# Patient Record
Sex: Female | Born: 1935 | Race: White | Hispanic: No | Smoking: Never smoker
Health system: Southern US, Community
[De-identification: ages and names within clinical notes are randomized; demographics above are authoritative.]

## PROBLEM LIST (undated history)

## (undated) DIAGNOSIS — E785 Hyperlipidemia, unspecified: Secondary | ICD-10-CM

---

## 2014-07-25 ENCOUNTER — Emergency Department (INDEPENDENT_AMBULATORY_CARE_PROVIDER_SITE_OTHER): Payer: Medicare Other

## 2014-07-25 ENCOUNTER — Encounter: Payer: Self-pay | Admitting: *Deleted

## 2014-07-25 ENCOUNTER — Emergency Department (INDEPENDENT_AMBULATORY_CARE_PROVIDER_SITE_OTHER)
Admission: EM | Admit: 2014-07-25 | Discharge: 2014-07-25 | Disposition: A | Discharge status: Home / Self Care | Payer: Medicare Other | Source: Home / Self Care | Attending: Family Medicine | Admitting: Family Medicine

## 2014-07-25 DIAGNOSIS — S0083XA Contusion of other part of head, initial encounter: Secondary | ICD-10-CM

## 2014-07-25 DIAGNOSIS — S61412A Laceration without foreign body of left hand, initial encounter: Secondary | ICD-10-CM

## 2014-07-25 DIAGNOSIS — M25512 Pain in left shoulder: Secondary | ICD-10-CM

## 2014-07-25 DIAGNOSIS — S80212A Abrasion, left knee, initial encounter: Secondary | ICD-10-CM

## 2014-07-25 DIAGNOSIS — S41112A Laceration without foreign body of left upper arm, initial encounter: Secondary | ICD-10-CM

## 2014-07-25 DIAGNOSIS — M19032 Primary osteoarthritis, left wrist: Secondary | ICD-10-CM | POA: Diagnosis not present

## 2014-07-25 HISTORY — DX: Hyperlipidemia, unspecified: E78.5

## 2014-07-25 NOTE — ED Notes (Signed)
Pt c/o LT side facial pain, LT hand/wrist abrasion, LT shoulder pain and abrasion and LT knee x 1345 post fall on concrete at a ice cream shop.

## 2014-07-25 NOTE — ED Provider Notes (Signed)
CSN: [id number]     Arrival date & time 07/25/14  1559 History   First MD Initiated Contact with Patient 07/25/14 1614     Chief Complaint  Patient presents with  . Fall      HPI Comments: About 3 hours ago patient slipped on a floor step-off and fell, striking her left knee, left hand/wrist, left shoulder, and left face.  She suffered skin tear to left knee, left thumb, and left shoulder, and an abrasion to her left cheek.  No loss of consciousness.  She denies headache, blurred vision, nausea/vomiting, weakness or other neurologic symptoms.  She states that she would like to visit her dentist to evaluate the wound on her face.  The history is provided by the patient and the spouse.    Past Medical History  Diagnosis Date  . Hyperlipidemia    History reviewed. No pertinent past surgical history. History reviewed. No pertinent family history. History  Substance Use Topics  . Smoking status: Never Smoker   . Smokeless tobacco: Not on file  . Alcohol Use: No   OB History    No data available     Review of Systems  Constitutional: Negative for diaphoresis and activity change.  HENT: Positive for facial swelling. Negative for ear pain and nosebleeds.   Eyes: Negative.   Respiratory: Negative.   Cardiovascular: Negative.   Gastrointestinal: Negative.   Genitourinary: Negative.   Musculoskeletal: Positive for joint swelling. Negative for myalgias, back pain, neck pain and neck stiffness.  Skin: Positive for wound.  Neurological: Negative for dizziness, syncope, facial asymmetry, speech difficulty, weakness, light-headedness, numbness and headaches.  All other systems reviewed and are negative.   Allergies  Codeine and Sulfa antibiotics  Home Medications   Prior to Admission medications   Not on File   BP 124/55 mmHg  Pulse 69  Resp 16  SpO2 98% Physical Exam  Constitutional: She is oriented to person, place, and time. She appears well-developed and well-nourished. No  distress.  Patient is alert and oriented.  HENT:  Head: Normocephalic. Head is with abrasion and with contusion. Head is without raccoon's eyes, without Battle's sign and without laceration.    Right Ear: External ear normal.  Left Ear: External ear normal.  Nose: Nose normal.  Mouth/Throat: Oropharynx is clear and moist.  Patient has swelling and tenderness over her left cheek and zygomatic process.  No TMJ tenderness.  No mouth lesions or injury.  Mandible has full range of motion   Eyes: Conjunctivae and EOM are normal. Pupils are equal, round, and reactive to light.  Neck: Normal range of motion.  Cardiovascular: Normal heart sounds.   Pulmonary/Chest: Breath sounds normal.  Abdominal: Bowel sounds are normal. There is no tenderness.  Musculoskeletal:       Left knee: She exhibits laceration. Tenderness found.       Arms:      Hands:      Legs: Left shoulder has a 4cm diameter skin tear as noted on diagram. Left shoulder has decreased range of motion.  Left hand has tenderness over first metacarpo-carpal joint.  Overlying the first metacarpal dorsally is a 2cm skin tear as noted on diagram.  Left knee has full range of motion with minimal tenderness to palpation.  There is a 2cm diameter skin tear as noted on diagram.  Edges are well apposed and no closure necessary.         Neurological: She is alert and oriented to person, place, and time.  She has normal reflexes. No cranial nerve deficit. Coordination normal.  Skin: Skin is warm and dry.  Nursing note and vitals reviewed.   ED Course  Procedures  Laceration Repair (Dermabond) Discussed benefits and risks of procedure and verbal consent obtained. Using sterile technique, cleansed wounds on left shoulder and left knee with normal saline.  Wounds carefully inspected for debris and foreign bodies; none found.  Wounds edges carefully approximated into normal anatomic position with narrow 3mm wide pieces of SteriStrips and  sealed with Dermabond.  Wound precautions explained to patient.  Wound left shoulder covered with appropriately sized piece of Mepelex Border dressing.  Wound left hand covered with Mepelex Lite dressing and secured with Kerlix.  No closure necessary for skin tear left knee; [employer] Border dressing.    Imaging Review Dg Wrist Complete Left  07/25/2014   CLINICAL DATA:  Status post fall today. Left wrist pain. Initial encounter. Initial encounter.  EXAM: LEFT WRIST - COMPLETE 3+ VIEW  COMPARISON:  None.  FINDINGS: No acute bony or joint abnormality is identified. Advanced degenerative disease is seen about the first [facility] joint. Milder degree of scaphoid trapezium trapezoid joint degenerative change is identified. Soft tissues are unremarkable.  IMPRESSION: No acute abnormality.  First CMC and STT osteoarthritis.   Electronically Signed   By: Drusilla Kannerhomas  Dalessio M.D.   On: 07/25/2014 18:07   Dg Shoulder Left  07/25/2014   CLINICAL DATA:  Fall. LEFT shoulder injury. LEFT shoulder trauma. Initial encounter.  EXAM: LEFT SHOULDER - 2+ VIEW  COMPARISON:  None.  FINDINGS: Osteopenia. Glenohumeral joint is located. Proximal humerus appears intact. Clavicle appears intact.  IMPRESSION: No displaced fracture or acute osseous abnormality.   Electronically Signed   By: Andreas NewportGeoffrey  Lamke M.D.   On: 07/25/2014 18:19     MDM   1. Contusion of face, initial encounter   2. Skin tear of hand without complication, left, initial encounter   3. Skin tear of upper arm without complication, left, initial encounter   4. Abrasion of left knee, initial encounter     Leave bandages in place for about 3 days.  Keep clean and dry.  Apply ice pack for 15 to 20 minutes, 3 to 4 times daily  Continue until swelling decreases.  Patient refused CT head.  I then ordered X-rays facial bones and she refused to continue with study. Advised her to followup with dentist for evaluation left facial wound. May take Tylenol for pain. Return  for any signs of infection.    Lattie HawStephen A Beese, MD 07/27/14 [phone]

## 2014-07-25 NOTE — Discharge Instructions (Signed)
Leave bandages in place for about 3 days.  Keep clean and dry.  Apply ice pack for 15 to 20 minutes, 3 to 4 times daily  Continue until swelling decreases.  Followup with dentist for evaluation left facial wound. May take Tylenol for pain. Return for any signs of infection.

## 2014-07-27 ENCOUNTER — Telehealth: Payer: Self-pay | Admitting: *Deleted

## 2017-04-21 ENCOUNTER — Ambulatory Visit (HOSPITAL_BASED_OUTPATIENT_CLINIC_OR_DEPARTMENT_OTHER)
Admission: RE | Admit: 2017-04-21 | Discharge: 2017-04-21 | Disposition: A | Discharge status: Home / Self Care | Payer: Medicare Other | Source: Ambulatory Visit | Attending: Nurse Practitioner | Admitting: Nurse Practitioner

## 2017-04-21 ENCOUNTER — Other Ambulatory Visit (HOSPITAL_BASED_OUTPATIENT_CLINIC_OR_DEPARTMENT_OTHER): Payer: Self-pay | Admitting: Nurse Practitioner

## 2017-04-21 DIAGNOSIS — K5900 Constipation, unspecified: Secondary | ICD-10-CM | POA: Diagnosis not present

## 2017-04-21 DIAGNOSIS — R1031 Right lower quadrant pain: Secondary | ICD-10-CM | POA: Insufficient documentation

## 2018-05-05 ENCOUNTER — Emergency Department (HOSPITAL_BASED_OUTPATIENT_CLINIC_OR_DEPARTMENT_OTHER): Payer: Medicare Other

## 2018-05-05 ENCOUNTER — Emergency Department (HOSPITAL_BASED_OUTPATIENT_CLINIC_OR_DEPARTMENT_OTHER)
Admission: EM | Admit: 2018-05-05 | Discharge: 2018-05-05 | Disposition: A | Discharge status: Home / Self Care | Payer: Medicare Other | Attending: Emergency Medicine | Admitting: Emergency Medicine

## 2018-05-05 ENCOUNTER — Encounter (HOSPITAL_BASED_OUTPATIENT_CLINIC_OR_DEPARTMENT_OTHER): Payer: Self-pay | Admitting: *Deleted

## 2018-05-05 ENCOUNTER — Other Ambulatory Visit: Payer: Self-pay

## 2018-05-05 DIAGNOSIS — S0181XA Laceration without foreign body of other part of head, initial encounter: Secondary | ICD-10-CM | POA: Insufficient documentation

## 2018-05-05 DIAGNOSIS — Y929 Unspecified place or not applicable: Secondary | ICD-10-CM | POA: Insufficient documentation

## 2018-05-05 DIAGNOSIS — Y999 Unspecified external cause status: Secondary | ICD-10-CM | POA: Diagnosis not present

## 2018-05-05 DIAGNOSIS — S0990XA Unspecified injury of head, initial encounter: Secondary | ICD-10-CM | POA: Diagnosis present

## 2018-05-05 DIAGNOSIS — W01198A Fall on same level from slipping, tripping and stumbling with subsequent striking against other object, initial encounter: Secondary | ICD-10-CM | POA: Insufficient documentation

## 2018-05-05 DIAGNOSIS — W19XXXA Unspecified fall, initial encounter: Secondary | ICD-10-CM

## 2018-05-05 DIAGNOSIS — S60221A Contusion of right hand, initial encounter: Secondary | ICD-10-CM

## 2018-05-05 DIAGNOSIS — Y9389 Activity, other specified: Secondary | ICD-10-CM | POA: Diagnosis not present

## 2018-05-05 MED ORDER — LIDOCAINE HCL (PF) 1 % IJ SOLN
2.0000 mL | Freq: Once | INTRAMUSCULAR | Status: AC
Start: 2018-05-05 — End: 2018-05-05
  Administered 2018-05-05: 14:00:00
  Filled 2018-05-05: qty 5

## 2018-05-05 NOTE — ED Provider Notes (Addendum)
[facility] EMERGENCY DEPARTMENT Provider Note   CSN: [id number] Arrival date & time: 05/05/18  1147     History   Chief Complaint Chief Complaint  Patient presents with  . Fall    HPI Jean Gardner is a 85 y.o. female.  HPI Patient presents after a mechanical fall.  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  States she lost her balance and fell and hit her head on the floor.  No loss conscious.  Not on anticoagulation.  Has cut above her right eye.  Also some pain in her right thumb.  Tetanus is up-to-date.  No neck pain.  No confusion.  Otherwise at her baseline.  No vision changes. Past Medical History:  Diagnosis Date  . Hyperlipidemia     There are no active problems to display for this patient.   History reviewed. No pertinent surgical history.   OB History   No obstetric history on file.      Home Medications    Prior to Admission medications   Not on File    Family History No family history on file.  Social History Social History   Tobacco Use  . Smoking status: Never Smoker  . Smokeless tobacco: Never Used  Substance Use Topics  . Alcohol use: Yes  . Drug use: No     Allergies   Codeine and Sulfa antibiotics   Review of Systems Review of Systems  Constitutional: Negative for appetite change.  HENT: Negative for congestion.   Respiratory: Negative for shortness of breath.   Gastrointestinal: Negative for abdominal pain.  Genitourinary: Negative for flank pain.  Musculoskeletal: Negative for back pain.       Right thumb pain  Skin: Positive for wound.  Neurological: Negative for weakness.  Psychiatric/Behavioral: Negative for confusion.     Physical Exam Updated Vital Signs BP 134/64 (BP Location: Right Arm)   Pulse 64   Temp 98 F (36.7 C) (Oral)   Resp 16   Ht 5\' 2"  (1.575 m)   Wt 61.4 kg   SpO2 95%   BMI 24.77 kg/m   Physical Exam Vitals signs and nursing note reviewed.  HENT:     Head:  Normocephalic.     Comments: 1 cm laceration right lateral eyebrow area.    Mouth/Throat:     Mouth: Mucous membranes are moist.  Eyes:     Extraocular Movements: Extraocular movements intact.     Pupils: Pupils are equal, round, and reactive to light.     Comments: No bony tenderness.  Beginnings of periorbital ecchymosis.  Neck:     Musculoskeletal: Neck supple.  Cardiovascular:     Rate and Rhythm: Normal rate and regular rhythm.  Pulmonary:     Effort: Pulmonary effort is normal.  Abdominal:     Palpations: Abdomen is soft.  Musculoskeletal:     Right lower leg: No edema.     Left lower leg: No edema.     Comments: Tenderness at first MCP joint on right hand.  Good range of motion.  Skin:    General: Skin is warm.     Capillary Refill: Capillary refill takes less than 2 seconds.  Neurological:     General: No focal deficit present.     Mental Status: She is alert.  Psychiatric:        Mood and Affect: Mood normal.      ED Treatments / Results  Labs (all labs ordered are listed, but  only abnormal results are displayed) Labs Reviewed - No data to display  EKG None  Radiology Dg Finger Thumb Right  Result Date: 05/05/2018 CLINICAL DATA:  Larey Seat today and tried to catch herself w/rt hand. Pain in rt 1st mc, cmc jt. EXAM: RIGHT THUMB 2+V COMPARISON:  None available FINDINGS: There is no evidence of fracture or dislocation. Soft tissues are unremarkable.Advanced DJD at the first carpometacarpal articulation. No fracture or dislocation. Mild subjective osteopenia. IMPRESSION: 1. No acute findings. 2. First carpometacarpal DJD. Electronically Signed   By: Corlis Leak M.D.   On: 05/05/2018 13:18    Procedures .Marland KitchenLaceration Repair Date/Time: 05/05/2018 2:29 PM Performed by: Benjiman Core, MD Authorized by: Benjiman Core, MD   Consent:    Consent obtained:  Verbal   Consent given by:  Patient   Risks discussed:  Infection, pain, need for additional repair, poor  wound healing, nerve damage and poor cosmetic result   Alternatives discussed:  No treatment Anesthesia (see MAR for exact dosages):    Anesthesia method:  Local infiltration   Local anesthetic:  Lidocaine 2% w/o epi Laceration details:    Location:  Face   Face location:  Forehead   Length (cm):  1 Repair type:    Repair type:  Simple Pre-procedure details:    Preparation:  Patient was prepped and draped in usual sterile fashion Exploration:    Hemostasis achieved with:  Direct pressure   Wound exploration: wound explored through full range of motion     Wound extent: no foreign bodies/material noted, no nerve damage noted and no tendon damage noted     Contaminated: no   Treatment:    Area cleansed with:  Saline   Amount of cleaning:  Standard   Visualized foreign bodies/material removed: no   Skin repair:    Repair method:  Sutures   Suture size:  6-0   Suture material:  Prolene   Suture technique:  Simple interrupted   Number of sutures:  3 Approximation:    Approximation:  Close Post-procedure details:    Dressing:  Sterile dressing   Patient tolerance of procedure:  Tolerated well, no immediate complications   (including critical care time)  Medications Ordered in ED Medications  lidocaine (PF) (XYLOCAINE) 1 % injection 2 mL ( Infiltration Given 05/05/18 1420)     Initial Impression / Assessment and Plan / ED Course  I have reviewed the triage vital signs and the nursing notes.  Pertinent labs & imaging results that were available during my care of the patient were reviewed by me and considered in my medical decision making (see chart for details).     Patient presents after a fall.  Laceration to face.  Closed in the ER.  Does not appear to need imaging at this time.  Hand x-ray reassuring.  Discharge home.  Sutures can be taken out in about 5 to 7 days.  Final Clinical Impressions(s) / ED Diagnoses   Final diagnoses:  Fall, initial encounter  Facial  laceration, initial encounter  Contusion of right hand, initial encounter    ED Discharge Orders    None       Benjiman Core, MD 05/05/18 1411    Benjiman Core, MD 05/05/18 1430

## 2018-05-05 NOTE — ED Notes (Signed)
Lidocaine at bedside.

## 2018-05-05 NOTE — ED Notes (Addendum)
Pt states she slipped and fell, hitting her head above right eye and injuring her right thumb. Pt has bruising to right temporal area and laceration above right eye. Pt denies loc, denies dizziness, nausea or vomiting. Pt denies dizziness causing fall, states she slipped, hitting her head on a tile floor when turning from her closet. Pt also c/o pain to thumb, states it has bruising.

## 2018-05-05 NOTE — ED Notes (Signed)
Patient transported to X-ray 

## 2018-05-05 NOTE — Discharge Instructions (Addendum)
Have the stitches taken out in around 5 days. 

## 2018-05-05 NOTE — ED Triage Notes (Signed)
She slipped and fell this am. Laceration above her right eyebrow. No LOC.

## 2019-08-22 IMAGING — CR DG FINGER THUMB 2+V*R*
3 series · 3 of 3 positions shown · non-contrast
Comparison: None available

CLINICAL DATA: Fell today and tried to catch herself w/rt hand.
Pain in rt 1st mc, cmc jt.

EXAM:
RIGHT THUMB 2+V

[x finger pa right]
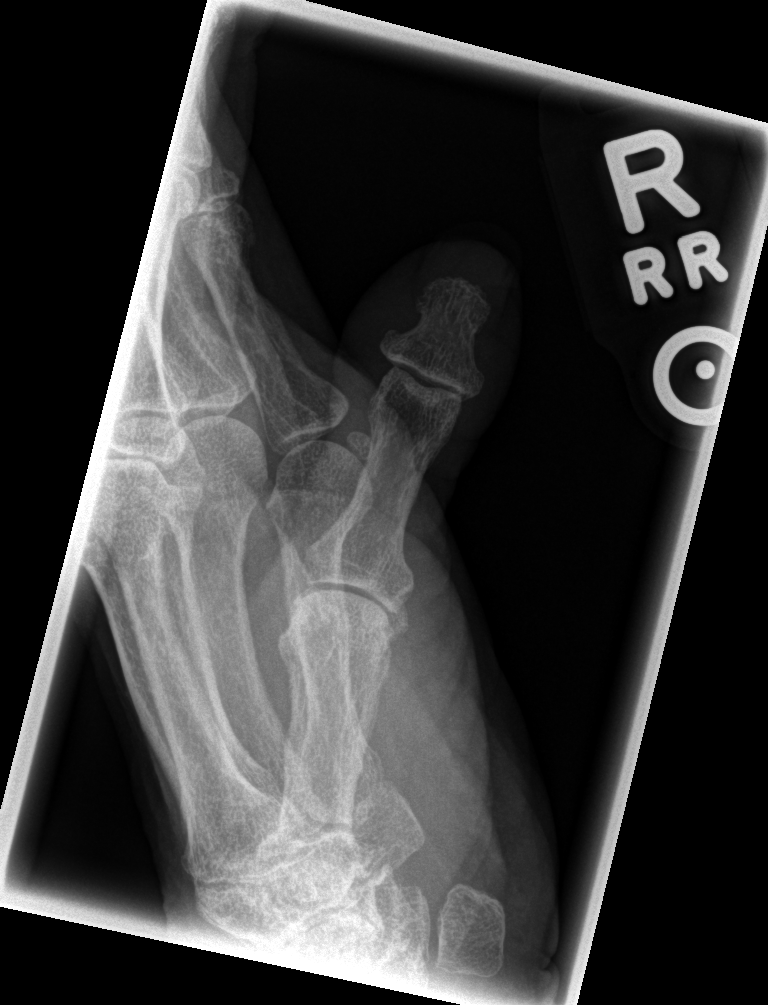

[x finger obl. right]
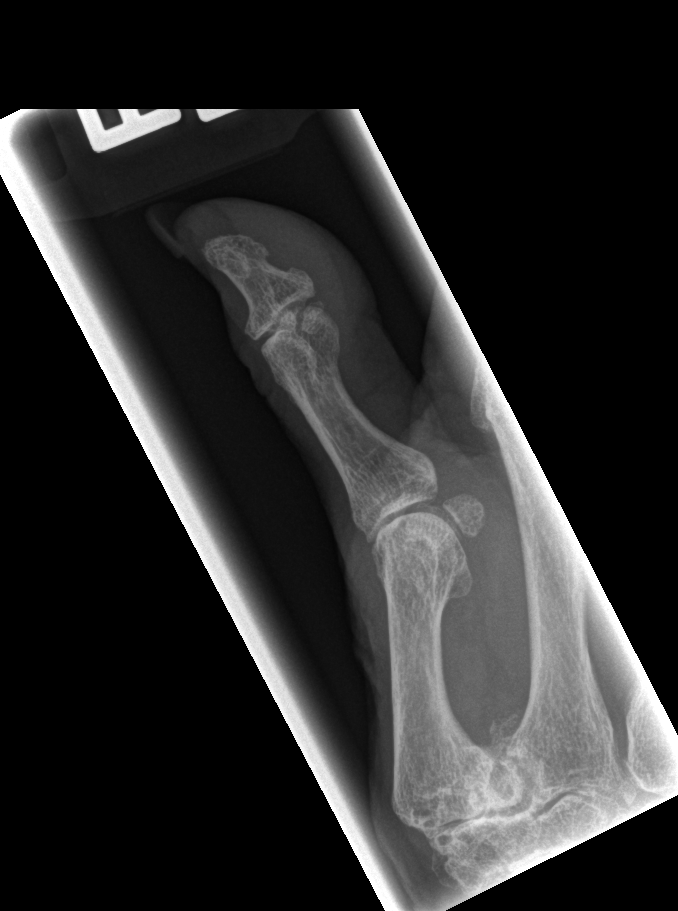

[x finger lateral right]
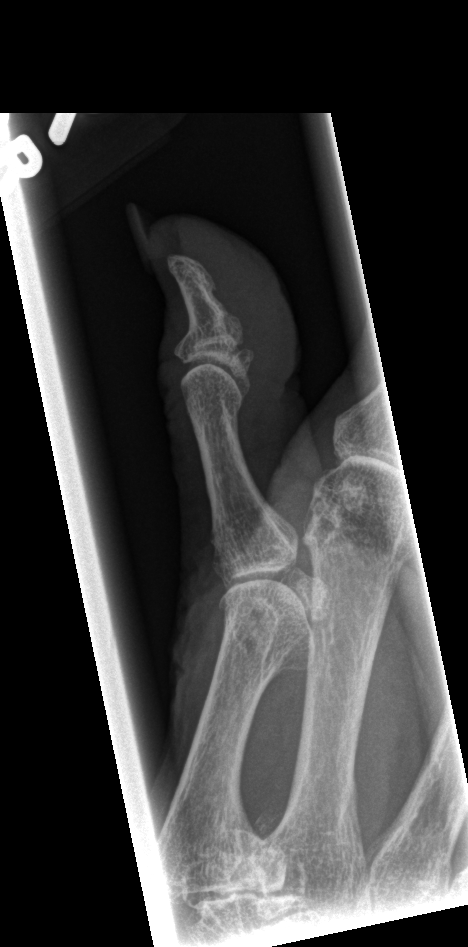

[3 of 3 positions shown; findings below may reference images not displayed]

FINDINGS: There is no evidence of fracture or dislocation. Soft tissues are
unremarkable.Advanced DJD at the first carpometacarpal articulation.
No fracture or dislocation. Mild subjective osteopenia.
IMPRESSION: 1. No acute findings.
2. First carpometacarpal DJD.

## 2021-05-25 ENCOUNTER — Emergency Department (HOSPITAL_COMMUNITY)
Admission: EM | Admit: 2021-05-25 | Discharge: 2021-05-26 | Disposition: A | Discharge status: Home / Self Care | Payer: Medicare Other | Attending: Emergency Medicine | Admitting: Emergency Medicine

## 2021-05-25 ENCOUNTER — Encounter (HOSPITAL_COMMUNITY): Payer: Self-pay

## 2021-05-25 ENCOUNTER — Other Ambulatory Visit: Payer: Self-pay

## 2021-05-25 DIAGNOSIS — F03911 Unspecified dementia, unspecified severity, with agitation: Secondary | ICD-10-CM | POA: Insufficient documentation

## 2021-05-25 DIAGNOSIS — F0390 Unspecified dementia without behavioral disturbance: Secondary | ICD-10-CM | POA: Insufficient documentation

## 2021-05-25 DIAGNOSIS — Z79899 Other long term (current) drug therapy: Secondary | ICD-10-CM | POA: Insufficient documentation

## 2021-05-25 DIAGNOSIS — F03918 Unspecified dementia, unspecified severity, with other behavioral disturbance: Secondary | ICD-10-CM | POA: Diagnosis present

## 2021-05-25 DIAGNOSIS — Z20822 Contact with and (suspected) exposure to covid-19: Secondary | ICD-10-CM | POA: Diagnosis not present

## 2021-05-25 DIAGNOSIS — R4689 Other symptoms and signs involving appearance and behavior: Secondary | ICD-10-CM

## 2021-05-25 LAB — CBC WITH DIFFERENTIAL/PLATELET
Abs Immature Granulocytes: 0.01 10*3/uL (ref 0.00–0.07)
Basophils Absolute: 0 10*3/uL (ref 0.0–0.1)
Basophils Relative: 1 %
Eosinophils Absolute: 0.1 10*3/uL (ref 0.0–0.5)
Eosinophils Relative: 1 %
HCT: 38.5 % (ref 36.0–46.0)
Hemoglobin: 12.9 g/dL (ref 12.0–15.0)
Immature Granulocytes: 0 %
Lymphocytes Relative: 23 %
Lymphs Abs: 1.6 10*3/uL (ref 0.7–4.0)
MCH: 32 pg (ref 26.0–34.0)
MCHC: 33.5 g/dL (ref 30.0–36.0)
MCV: 95.5 fL (ref 80.0–100.0)
Monocytes Absolute: 0.7 10*3/uL (ref 0.1–1.0)
Monocytes Relative: 11 %
Neutro Abs: 4.5 10*3/uL (ref 1.7–7.7)
Neutrophils Relative %: 64 %
Platelets: 134 10*3/uL — ABNORMAL LOW (ref 150–400)
RBC: 4.03 MIL/uL (ref 3.87–5.11)
RDW: 14.3 % (ref 11.5–15.5)
WBC: 6.9 10*3/uL (ref 4.0–10.5)
nRBC: 0 % (ref 0.0–0.2)

## 2021-05-25 LAB — URINALYSIS, ROUTINE W REFLEX MICROSCOPIC
Bilirubin Urine: NEGATIVE
Glucose, UA: NEGATIVE mg/dL
Hgb urine dipstick: NEGATIVE
Ketones, ur: 5 mg/dL — AB
Leukocytes,Ua: NEGATIVE
Nitrite: NEGATIVE
Protein, ur: NEGATIVE mg/dL
Specific Gravity, Urine: 1.02 (ref 1.005–1.030)
pH: 6 (ref 5.0–8.0)

## 2021-05-25 LAB — COMPREHENSIVE METABOLIC PANEL
ALT: 14 U/L (ref 0–44)
AST: 19 U/L (ref 15–41)
Albumin: 3.5 g/dL (ref 3.5–5.0)
Alkaline Phosphatase: 78 U/L (ref 38–126)
Anion gap: 8 (ref 5–15)
BUN: 30 mg/dL — ABNORMAL HIGH (ref 8–23)
CO2: 23 mmol/L (ref 22–32)
Calcium: 9.3 mg/dL (ref 8.9–10.3)
Chloride: 105 mmol/L (ref 98–111)
Creatinine, Ser: 0.88 mg/dL (ref 0.44–1.00)
GFR, Estimated: >60 mL/min (ref >60)
Glucose, Bld: 94 mg/dL (ref 70–99)
Potassium: 4.3 mmol/L (ref 3.5–5.1)
Sodium: 136 mmol/L (ref 135–145)
Total Bilirubin: 0.5 mg/dL (ref 0.3–1.2)
Total Protein: 6.9 g/dL (ref 6.5–8.1)

## 2021-05-25 LAB — ETHANOL: Alcohol, Ethyl (B): <10 mg/dL (ref <10)

## 2021-05-25 MED ORDER — HALOPERIDOL 1 MG PO TABS: 2.0000 mg | ORAL_TABLET | ORAL | Status: DC

## 2021-05-25 MED ORDER — HALOPERIDOL LACTATE 5 MG/ML IJ SOLN
2.0000 mg | Freq: Four times a day (QID) | INTRAMUSCULAR | Status: DC | PRN
  Administered 2021-05-25: 2 mg via INTRAMUSCULAR
  Filled 2021-05-25: qty 1
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
## 2021-05-25 NOTE — ED Notes (Signed)
Daughter at bedside. Daughter is very helpful in getting patient cooperate at this time.  ?

## 2021-05-25 NOTE — ED Provider Notes (Signed)
Behavioral health will reevaluate in the morning ?  ?Bethann Berkshire, MD ?05/25/21 2141 ? ?
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
## 2021-05-25 NOTE — ED Notes (Signed)
Patient refusing vital signs, bloodwork, and EKG at this time.  ?
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
## 2021-05-25 NOTE — BH Assessment (Signed)
Comprehensive Clinical Assessment (CCA) Note  05/25/2021 Jean Gardner [id number]  DISPOSITION: Gave clinical report to Roselyn Bering, NP recommended Pt be observed overnight and evaluated by psychiatry in the morning. Notified Dr. Jomarie Longs Zammitt and Launa Flight, RN of recommendation via secure message.  The patient demonstrates the following risk factors for suicide: Chronic risk factors for suicide include: medical illness dementia . Acute risk factors for suicide include: N/A. Protective factors for this patient include: positive social support, responsibility to others (children, family), hope for the future, religious beliefs against suicide, and life satisfaction. Considering these factors, the overall suicide risk at this point appears to be low. Patient is appropriate for outpatient follow up.   Flowsheet Row ED from 05/25/2021 in [facility]  C-SSRS RISK CATEGORY No Risk      Pt is a 88 year old widowed female who presents unaccompanied to [facility] via EMS from [facility] assisted living due to aggressive behavior. Pt has a diagnosis of dementia and staff at [facility] report she has been paranoid, agitated, yelling, abusive to staff, and hitting people with her walker. Pt says she is "fine" and she does not remember being aggressive towards people. She says her daughter wanted her to come to the ED because "she does not want to take care of me." She says her daughter sees her every day at [facility]. She says her daughter "is telling stories against me." Pt also says she is upset because staff in the ED will not take her to the bathroom and told her to void in her bed. She denies depressive symptoms. She denies feeling anxious. She denies suicidal ideation or history of suicide attempts. She denies homicidal ideation. She denies auditory or visual hallucinations. Pt denies alcohol or other substance use.   With Pt's consent, TTS spoke with Pt's daughter,  Jean Gardner [phone]. She says she was contacted by staff at [facility] because her mother was violent with staff, yelling, and hitting other residents with her walker. She says her mother has episodes of paranoia and aggression and says Pt is going through an episode today. She says she witnessed Pt being rude and uncooperative with staff at [facility] today. She says Pt has been having these episodes for the past year. Ms Schroll says Pt appeared happy four days ago. She says Pt has a history of falls and needs assistance with ADLs. She says Pt has never been suicidal. She describes Pt as "narcissistic" and says Pt tends to target people, be mean to them, and drives people away, including her other children. Ms Tilden Fossa says she spoke to the resident director at [facility] who said if Pt continues to be aggressive she will not be able to stay there and recommends Pt be prescribed some kind of medication to help calm her. Ms Tilden Fossa says she would like Pt to be given medication and return to [facility].  Pt is cover by a blanket, alert and oriented person, place, and situation. She does not know the date. Pt speaks in a clear tone, at low volume and slow pace. Motor behavior appears normal. Eye contact is good. Pt's mood is slightly irritable and affect is congruent with mood. Thought process is coherent and relevant. There is no indication Pt is currently responding to internal stimuli. Pt was cooperative throughout assessment. She says she wants to return to [facility].   Chief Complaint:  Chief Complaint  Patient presents with   Dementia   Aggressive  Behavior   Visit Diagnosis: F01.51 Major vascular neurocognitive disorder, Probable, With behavioral disturbance   CCA Screening, Triage and Referral (STR)  Patient Reported Information How did you hear about [place]? Other (Comment) (ALF staff)  Referral name: No data recorded Referral phone number: No data recorded  Whom do you see for  routine medical problems? No data recorded Practice/Facility Name: No data recorded Practice/Facility Phone Number: No data recorded Name of Contact: No data recorded Contact Number: No data recorded Contact Fax Number: No data recorded Prescriber Name: No data recorded Prescriber Address (if known): No data recorded  What Is the Reason for Your Visit/Call Today? Pt has diagnosis of dementia and has been agitated, aggressive and paranoid.  How Long Has This Been Causing You Problems? 1 wk - 1 month  What Do You Feel Would Help You the Most Today? Medication(s)   Have You Recently Been in Any Inpatient Treatment (Hospital/Detox/Crisis Center/28-Day Program)? No data recorded Name/Location of Program/Hospital:No data recorded How Long Were You There? No data recorded When Were You Discharged? No data recorded  Have You Ever Received Services From [facility] Before? No data recorded Who Do You See at [facility]? No data recorded  Have You Recently Had Any Thoughts About Hurting Yourself? No  Are You Planning to Commit Suicide/Harm Yourself At This time? No   Have you Recently Had Thoughts About Hurting Someone Karolee Ohs? No  Explanation: No data recorded  Have You Used Any Alcohol or Drugs in the Past 24 Hours? No  How Long Ago Did You Use Drugs or Alcohol? No data recorded What Did You Use and How Much? No data recorded  Do You Currently Have a Therapist/Psychiatrist? No  Name of Therapist/Psychiatrist: No data recorded  Have You Been Recently Discharged From Any Office Practice or Programs? No  Explanation of Discharge From Practice/Program: No data recorded    CCA Screening Triage Referral Assessment Type of Contact: Tele-Assessment  Is this Initial or Reassessment? Initial Assessment  Date Telepsych consult ordered in [facility]:  05/25/21  Time Telepsych consult ordered in [facility]:  1625   Patient Reported Information Reviewed? No data recorded Patient Left Without Being  Seen? No data recorded Reason for Not Completing Assessment: No data recorded  Collateral Involvement: Daughter: Jean Gardner [phone]   Does Patient Have a Court Appointed Legal Guardian? No data recorded Name and Contact of Legal Guardian: No data recorded If Minor and Not Living with Parent(s), Who has Custody? NA  Is CPS involved or ever been involved? Never  Is APS involved or ever been involved? Never   Patient Determined To Be At Risk for Harm To Self or Others Based on Review of Patient Reported Information or Presenting Complaint? Yes, for Harm to Others  Method: No Plan  Availability of Means: No access or NA  Intent: Vague intent or NA  Notification Required: No need or identified person  Additional Information for Danger to Others Potential: No data recorded Additional Comments for Danger to Others Potential: Pt has been aggressive to staff and residents at ALF.  Are There Guns or Other Weapons in Your Home? No  Types of Guns/Weapons: No data recorded Are These Weapons Safely Secured?                            No data recorded Who Could Verify You Are Able To Have These Secured: No data recorded Do You Have any Outstanding Charges, Pending  Court Dates, Parole/Probation? No  Contacted To Inform of Risk of Harm To Self or Others: Family/Significant Other:   Location of Assessment: [facility] ED   Does Patient Present under Involuntary Commitment? No  IVC Papers Initial File Date: No data recorded  [place] of Residence: [place]   Patient Currently Receiving the Following Services: Medication Management   Determination of Need: Urgent (48 hours)   Options For Referral: Herbalist; Medication Management     CCA Biopsychosocial Intake/Chief Complaint:  No data recorded Current Symptoms/Problems: No data recorded  Patient Reported Schizophrenia/Schizoaffective Diagnosis in Past: No   Strengths: Pt has family support  Preferences:  No data recorded Abilities: No data recorded  Type of Services Patient Feels are Needed: No data recorded  Initial Clinical Notes/Concerns: No data recorded  Mental Health Symptoms Depression:   Change in energy/activity; Difficulty Concentrating; Irritability   Duration of Depressive symptoms:  Greater than two weeks   Mania:   None   Anxiety:    Irritability; Restlessness; Tension   Psychosis:   Delusions   Duration of Psychotic symptoms:  Less than six months   Trauma:   None   Obsessions:   None   Compulsions:   None   Inattention:   N/A   Hyperactivity/Impulsivity:   N/A   Oppositional/Defiant Behaviors:   Aggression towards people/animals; Angry; Argumentative; Temper   Emotional Irregularity:   None   Other Mood/Personality Symptoms:   NA    Mental Status Exam Appearance and self-care  Stature:   Small   Weight:   Average weight   Clothing:   -- (Covered by blanket)   Grooming:   Normal   Cosmetic use:   None   Posture/gait:   Normal   Motor activity:   Not Remarkable   Sensorium  Attention:   Normal   Concentration:   Normal   Orientation:   Person; Place; Situation   Recall/memory:   Defective in Recent; Defective in Remote   Affect and Mood  Affect:   Appropriate   Mood:   Irritable   Relating  Eye contact:   Normal   Facial expression:   Responsive   Attitude toward examiner:   Cooperative   Thought and Language  Speech flow:  Normal; Slow   Thought content:   Delusions   Preoccupation:   None   Hallucinations:   None   Organization:  No data recorded  [employer] of Knowledge:   Average   Intelligence:   Average   Abstraction:   Normal   Judgement:   Impaired   Reality Testing:   Distorted; Variable   Insight:   Poor   Decision Making:   Only simple; Vacilates   Social Functioning  Social Maturity:   Self-centered   Social Judgement:   Normal    Stress  Stressors:   Illness   Coping Ability:   Deficient supports   Skill Deficits:   Self-care; Interpersonal; Activities of daily living   Supports:   Family     Religion: Religion/Spirituality Are You A Religious Person?: Yes What is Your Religious Affiliation?: Presbyterian How Might This Affect Treatment?: NA  Leisure/Recreation: Leisure / Recreation Do You Have Hobbies?: No  Exercise/Diet: Exercise/Diet Do You Exercise?: No Have You Gained or Lost A Significant Amount of Weight in the Past Six Months?: No Do You Follow a Special Diet?: No Do You Have Any Trouble Sleeping?: No   CCA Employment/Education Employment/Work Situation: 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?: No  Education: Education Is Patient Currently Attending School?: No   CCA Family/Childhood History Family and Relationship History: Family history Marital status: Widowed Widowed, when?: Pt has been windiwed three times Does patient have children?: Yes How many children?: 3 How is patient's relationship with their children?: Only one daughter communicates with Pt  Childhood History:  Childhood History By whom was/is the patient raised?: Both parents Did patient suffer any verbal/emotional/physical/sexual abuse as a child?: No Did patient suffer from severe childhood neglect?: No Has patient ever been sexually abused/assaulted/raped as an adolescent or adult?: No Was the patient ever a victim of a crime or a disaster?: No Witnessed domestic violence?: No Has patient been affected by domestic violence as an adult?: No  Child/Adolescent Assessment:     CCA Substance Use Alcohol/Drug Use: Alcohol / Drug Use Pain Medications: Denies abuse Prescriptions: Denies abuse Over the Counter: Denies abuse History of alcohol / drug use?: No history of alcohol / drug  abuse Longest period of sobriety (when/how long): NA                         ASAM's:  Six Dimensions of Multidimensional Assessment  Dimension 1:  Acute Intoxication and/or Withdrawal Potential:      Dimension 2:  Biomedical Conditions and Complications:      Dimension 3:  Emotional, Behavioral, or Cognitive Conditions and Complications:     Dimension 4:  Readiness to Change:     Dimension 5:  Relapse, Continued use, or Continued Problem Potential:     Dimension 6:  Recovery/Living Environment:     ASAM Severity Score:    ASAM Recommended Level of Treatment:     Substance use Disorder (SUD)    Recommendations for Services/Supports/Treatments:    DSM5 Diagnoses: There are no problems to display for this patient.   Patient Centered Plan: Patient is on the following Treatment Plan(s):  Impulse Control   Referrals to Alternative Service(s): Referred to Alternative Service(s):   Place:   Date:   Time:    Referred to Alternative Service(s):   Place:   Date:   Time:    Referred to Alternative Service(s):   Place:   Date:   Time:    Referred to Alternative Service(s):   Place:   Date:   Time:      @BHCOLLABOFCARE @  Patsy BaltimoreWarrick Jr, Harlin RainFord Ellis, [facility]

## 2021-05-26 ENCOUNTER — Encounter (HOSPITAL_COMMUNITY): Payer: Self-pay | Admitting: Registered Nurse

## 2021-05-26 DIAGNOSIS — F03918 Unspecified dementia, unspecified severity, with other behavioral disturbance: Secondary | ICD-10-CM | POA: Diagnosis present

## 2021-05-26 DIAGNOSIS — R4689 Other symptoms and signs involving appearance and behavior: Secondary | ICD-10-CM | POA: Insufficient documentation

## 2021-05-26 DIAGNOSIS — F03911 Unspecified dementia, unspecified severity, with agitation: Secondary | ICD-10-CM | POA: Diagnosis not present

## 2021-05-26 LAB — RESP PANEL BY RT-PCR (FLU A&B, COVID) ARPGX2
Influenza A by PCR: NEGATIVE
Influenza B by PCR: NEGATIVE
SARS Coronavirus 2 by RT PCR: NEGATIVE

## 2021-05-26 MED ORDER — SERTRALINE HCL 50 MG PO TABS: 25.0000 mg | ORAL_TABLET | Freq: Every day | ORAL | Status: DC

## 2021-05-26 MED ORDER — RISPERIDONE 0.5 MG PO TABS
0.5000 mg | ORAL_TABLET | Freq: Two times a day (BID) | ORAL | Status: DC
  Administered 2021-05-26: 0.5 mg via ORAL
  Filled 2021-05-26: qty 1

## 2021-05-26 MED ORDER — RISPERIDONE 0.5 MG PO TABS: 0.2500 mg | ORAL_TABLET | Freq: Two times a day (BID) | ORAL | 0 refills | Status: AC

## 2021-05-26 NOTE — ED Provider Notes (Signed)
?  Physical Exam  ?BP 108/85   Pulse 75   Temp (!) 97.5 ?F (36.4 ?C) (Oral)   Resp 20   SpO2 95%  ? ?Physical Exam ? ?Procedures  ?Procedures ? ?ED Course / MDM  ?  ?Medical Decision Making ?Amount and/or Complexity of Data Reviewed ?Labs: ordered. ?ECG/medicine tests: ordered. ? ?Risk ?Prescription drug management. ? ? ?Has been seen by psychiatry and cleared for discharge back to nursing home.  Increase Risperdal to 0.5 mg twice a day. ? ? ? ? ?  ?Benjiman Core, MD ?05/26/21 1357 ? ?

## 2021-05-26 NOTE — ED Notes (Addendum)
Pt is being very difficult and uncooperative with staff. ?

## 2021-05-26 NOTE — ED Notes (Signed)
Spoke to daughter Earney Navy, gave brief update. ?

## 2021-05-26 NOTE — Discharge Instructions (Signed)
Increase the Risperdal to 0.5 mg twice a day.  Follow-up with her providers. ?

## 2021-05-26 NOTE — ED Notes (Signed)
Gave pt breakfast tray. Pt pleasant and cooperative at this time ?

## 2021-05-26 NOTE — Consult Note (Addendum)
[facility] Face-to-Face Psychiatry Consult   Reason for Consult:  Aggressive behavior Referring Physician:  Bethann Berkshire, MD Patient Identification: Jean Gardner MRN:  [id number] Principal Diagnosis: Dementia with aggressive behavior Diagnosis:  Principal Problem:   Dementia with aggressive behavior   Total Time spent with patient: 30 minutes  Subjective:   Jean Gardner is a 88 y.o. female patient admitted to [facility] ED with complaints of dementia and aggressive behavior towards staff and residents of nursing facility  HPI:  Jean Gardner, 88 y.o., female patient seen face to face by this provider, consulted with Dr. Nelly Rout; and chart reviewed on 05/26/21.  On evaluation Jean Gardner reports she is not sure why she is in the hospital and states she doesn't recall assaulting staff or residents at home.  Patient denies suicidal/self-harm/homicidal ideation, psychosis, and paranoia.    During evaluation Jean Gardner is sitting up in bed in no acute distress.  She is alert, oriented x to self and place, calm, cooperative throughout assessment.  Her mood is euthymic with congruent affect.  She has normal speech, and behavior.  Objectively there is no evidence of psychosis/mania or delusional thinking.  Patient has history of dementia and is not a good historian.  There is no distractibility, or pre-occupation.  She also denies suicidal/self-harm/homicidal ideation, psychosis, and paranoia.  Patient answered question appropriately.  Patients Risperdal increased to 0.5 mg bid to assist with agitation and aggressive behavior.  Patient can follow up with her neurologist or her primary care provider.    Past Psychiatric History: dementia  Risk to Self:   Risk to Others:   Prior Inpatient Therapy:   Prior Outpatient Therapy:    Past Medical History:  Past Medical History:  Diagnosis Date   Hyperlipidemia    History reviewed. No pertinent surgical history. Family History: History reviewed. No pertinent family  history. Family Psychiatric  History: Unaware Social History:  Social History   Substance and Sexual Activity  Alcohol Use Yes     Social History   Substance and Sexual Activity  Drug Use No    Social History   Socioeconomic History   Marital status: Widowed    Spouse name: Not on file   Number of children: Not on file   Years of education: Not on file   Highest education level: Not on file  Occupational History   Not on file  Tobacco Use   Smoking status: Never   Smokeless tobacco: Never  Substance and Sexual Activity   Alcohol use: Yes   Drug use: No   Sexual activity: Not on file  Other Topics Concern   Not on file  Social History Narrative   Not on file   Social Determinants of Health   Financial Resource Strain: Not on file  Food Insecurity: Not on file  Transportation Needs: Not on file  Physical Activity: Not on file  Stress: Not on file  Social Connections: Not on file   Additional Social History:    Allergies:   Allergies  Allergen Reactions   Codeine Nausea And Vomiting and Other (See Comments)    "Allergic," per MAR    Ipratropium Bromide Swelling and Other (See Comments)    Angioedema- "Allergic," per MAR   Sulfa Antibiotics Other (See Comments)    "Allergic," per [facility]     Labs:  Results for orders placed or performed during the hospital encounter of 05/25/21 (from the past 48 hour(s))  Urinalysis, Routine w reflex microscopic Urine, Catheterized  Status: Abnormal   Collection Time: 05/25/21  3:00 PM  Result Value Ref Range   Color, Urine YELLOW YELLOW   APPearance CLEAR CLEAR   Specific Gravity, Urine 1.020 1.005 - 1.030   pH 6.0 5.0 - 8.0   Glucose, UA NEGATIVE NEGATIVE mg/dL   Hgb urine dipstick NEGATIVE NEGATIVE   Bilirubin Urine NEGATIVE NEGATIVE   Ketones, ur 5 (A) NEGATIVE mg/dL   Protein, ur NEGATIVE NEGATIVE mg/dL   Nitrite NEGATIVE NEGATIVE   Leukocytes,Ua NEGATIVE NEGATIVE    Comment: Performed at [facility], [address] [place], [place] [address]  CBC with Differential/Platelet     Status: Abnormal   Collection Time: 05/25/21  3:01 PM  Result Value Ref Range   WBC 6.9 4.0 - 10.5 K/uL   RBC 4.03 3.87 - 5.11 MIL/uL   Hemoglobin 12.9 12.0 - 15.0 g/dL   HCT 07.6 22.6 - 33.3 %   MCV 95.5 80.0 - 100.0 fL   MCH 32.0 26.0 - 34.0 pg   MCHC 33.5 30.0 - 36.0 g/dL   RDW 54.5 62.5 - 63.8 %   Platelets 134 (L) 150 - 400 K/uL   nRBC 0.0 0.0 - 0.2 %   Neutrophils Relative % 64 %   Neutro Abs 4.5 1.7 - 7.7 K/uL   Lymphocytes Relative 23 %   Lymphs Abs 1.6 0.7 - 4.0 K/uL   Monocytes Relative 11 %   Monocytes Absolute 0.7 0.1 - 1.0 K/uL   Eosinophils Relative 1 %   Eosinophils Absolute 0.1 0.0 - 0.5 K/uL   Basophils Relative 1 %   Basophils Absolute 0.0 0.0 - 0.1 K/uL   Immature Granulocytes 0 %   Abs Immature Granulocytes 0.01 0.00 - 0.07 K/uL    Comment: Performed at [facility], [address], [place], [place] [address]  Comprehensive metabolic panel     Status: Abnormal   Collection Time: 05/25/21  3:01 PM  Result Value Ref Range   Sodium 136 135 - 145 mmol/L   Potassium 4.3 3.5 - 5.1 mmol/L   Chloride 105 98 - 111 mmol/L   CO2 23 22 - 32 mmol/L   Glucose, Bld 94 70 - 99 mg/dL    Comment: Glucose reference range applies only to samples taken after fasting for at least 8 hours.   BUN 30 (H) 8 - 23 mg/dL   Creatinine, Ser 2.87 0.44 - 1.00 mg/dL   Calcium 9.3 8.9 - 68.1 mg/dL   Total Protein 6.9 6.5 - 8.1 g/dL   Albumin 3.5 3.5 - 5.0 g/dL   AST 19 15 - 41 U/L   ALT 14 0 - 44 U/L   Alkaline Phosphatase 78 38 - 126 U/L   Total Bilirubin 0.5 0.3 - 1.2 mg/dL   GFR, Estimated >15 >72 mL/min    Comment: (NOTE) Calculated using the CKD-EPI Creatinine Equation (2021)    Anion gap 8 5 - 15    Comment: Performed at [facility], [address] [place], [place] [address]  Ethanol     Status: None   Collection Time: 05/25/21  3:01 PM   Result Value Ref Range   Alcohol, Ethyl (B) <10 <10 mg/dL    Comment: (NOTE) Lowest detectable limit for serum alcohol is 10 mg/dL.  For medical purposes only. Performed at [facility], [address], [place], [place] [address]   Resp Panel by RT-PCR (Flu A&B, Covid) Nasopharyngeal Swab  Status: None   Collection Time: 05/26/21  1:27 AM   Specimen: Nasopharyngeal Swab; Nasopharyngeal(NP) swabs in vial transport medium  Result Value Ref Range   SARS Coronavirus 2 by RT PCR NEGATIVE NEGATIVE    Comment: (NOTE) SARS-CoV-2 target nucleic acids are NOT DETECTED.  The SARS-CoV-2 RNA is generally detectable in upper respiratory specimens during the acute phase of infection. The lowest concentration of SARS-CoV-2 viral copies this assay can detect is 138 copies/mL. A negative result does not preclude SARS-Cov-2 infection and should not be used as the sole basis for treatment or other patient management decisions. A negative result may occur with  improper specimen collection/handling, submission of specimen other than nasopharyngeal swab, presence of viral mutation(s) within the areas targeted by this assay, and inadequate number of viral copies(<138 copies/mL). A negative result must be combined with clinical observations, patient history, and epidemiological information. The expected result is Negative.  Fact Sheet for Patients:  BloggerCourse.com  Fact Sheet for Healthcare Providers:  SeriousBroker.it  This test is no t yet approved or cleared by the [place] FDA and  has been authorized for detection and/or diagnosis of SARS-CoV-2 by FDA under an Emergency Use Authorization (EUA). This EUA will remain  in effect (meaning this test can be used) for the duration of the COVID-19 declaration under Section 564(b)(1) of the Act, 21 U.S.C.section 360bbb-3(b)(1), unless the authorization is terminated  or  revoked sooner.       Influenza A by PCR NEGATIVE NEGATIVE   Influenza B by PCR NEGATIVE NEGATIVE    Comment: (NOTE) The Xpert Xpress SARS-CoV-2/FLU/RSV plus assay is intended as an aid in the diagnosis of influenza from Nasopharyngeal swab specimens and should not be used as a sole basis for treatment. Nasal washings and aspirates are unacceptable for Xpert Xpress SARS-CoV-2/FLU/RSV testing.  Fact Sheet for Patients: BloggerCourse.com  Fact Sheet for Healthcare Providers: SeriousBroker.it  This test is not yet approved or cleared by the [place] FDA and has been authorized for detection and/or diagnosis of SARS-CoV-2 by FDA under an Emergency Use Authorization (EUA). This EUA will remain in effect (meaning this test can be used) for the duration of the COVID-19 declaration under Section 564(b)(1) of the Act, 21 U.S.C. section 360bbb-3(b)(1), unless the authorization is terminated or revoked.  Performed at [facility], [address], [place], [place] [address]     Current Facility-Administered Medications  Medication Dose Route Frequency Provider Last Rate Last Admin   haloperidol lactate (HALDOL) injection 2 mg  2 mg Intramuscular Q6H PRN Lorre Nick, MD   2 mg at 05/25/21 1423   risperiDONE (RISPERDAL) tablet 0.5 mg  0.5 mg Oral BID Jasper Hanf B, NP   0.5 mg at 05/26/21 1420   sertraline (ZOLOFT) tablet 25 mg  25 mg Oral QHS Exie Chrismer B, NP       Current Outpatient Medications  Medication Sig Dispense Refill   acetaminophen (TYLENOL) 325 MG tablet Take 650 mg by mouth every 4 (four) hours as needed (or a temperature of 99 F or higher- NOT TO EXCEED A TOTAL OF 3,000 MG FROM ALL COMBINED SOURCES IN 24 HOURS).     atropine 1 % ophthalmic solution Place 4 drops under the tongue every 4 (four) hours as needed (for secretions or coughing).     augmented betamethasone dipropionate  (DIPROLENE-AF) 0.05 % cream Apply 1 application topically as needed (for psoriasis).     Cholecalciferol (VITAMIN D-3) 25 MCG (1000 UT) CAPS Take  1,000 Units by mouth in the morning.     cyanocobalamin (,VITAMIN B-12,) 1000 MCG/ML injection Inject 1,000 mcg into the muscle every 30 (thirty) days.     Morphine Sulfate (MORPHINE CONCENTRATE) 10 mg / 0.5 ml concentrated solution Take 5 mg by mouth every 4 (four) hours as needed ("for comfort care").     Multiple Vitamins-Minerals (MULTIVITAMIN GUMMIES WOMENS) CHEW Chew 1 tablet by mouth in the morning.     OXYGEN Inhale 1-4 L/min into the lungs as needed (for shortness of breath).     polyethylene glycol powder (GLYCOLAX/MIRALAX) 17 GM/SCOOP powder Take 17 g by mouth 2 (two) times daily as needed for mild constipation (MIX AND DRINK).     sertraline (ZOLOFT) 25 MG tablet Take 25 mg by mouth at bedtime.     tacrolimus (PROTOPIC) 0.1 % ointment Apply 1 application topically 2 (two) times daily as needed (for psoriasis).     risperiDONE (RISPERDAL) 0.5 MG tablet Take 0.5 tablets (0.25 mg total) by mouth in the morning and at bedtime. 60 tablet 0    Musculoskeletal: Strength & Muscle Tone: within normal limits Gait & Station:  did not see patient ambulate Patient leans: N/A   Psychiatric Specialty Exam:  Presentation  General Appearance: Appropriate for Environment Eye Contact:Good Speech:Clear and Coherent; Normal Rate Speech Volume:Normal Handedness:Right  Mood and Affect  Mood:Euthymic Affect:Congruent  Thought Process  Thought Processes:Linear; Coherent Descriptions of Associations:Circumstantial  Orientation:Partial  Thought Content:Rumination  History of Schizophrenia/Schizoaffective disorder:No  Duration of Psychotic Symptoms:N/A  Hallucinations:Hallucinations: None  Ideas of Reference:None  Suicidal Thoughts:Suicidal Thoughts: No  Homicidal Thoughts:Homicidal Thoughts: No   Sensorium  Memory:Immediate Poor;  Recent Poor Judgment:Fair Insight:Fair  Executive Functions  Concentration:Fair Attention Span:Poor Recall:Poor Fund of Knowledge:Fair Language:Fair  Psychomotor Activity  Psychomotor Activity:Psychomotor Activity: Normal  Assets  Assets:Communication Skills; Housing; Resilience; Social Support  Sleep  Sleep:Sleep: Good  Physical Exam: Physical Exam Vitals and nursing note reviewed. Exam conducted with a chaperone present.  Constitutional:      General: She is not in acute distress.    Appearance: Normal appearance. She is not ill-appearing.  Cardiovascular:     Rate and Rhythm: Normal rate.  Pulmonary:     Effort: Pulmonary effort is normal.  Neurological:     Mental Status: She is alert.     Comments: Oriented to self and place   Psychiatric:        Attention and Perception: Attention and perception normal. She does not perceive auditory or visual hallucinations.        Mood and Affect: Mood and affect normal.        Speech: Speech normal.        Behavior: Behavior is cooperative.        Thought Content: Thought content normal. Thought content is not paranoid or delusional. Thought content does not include homicidal or suicidal ideation.        Cognition and Memory: Memory is impaired.        Judgment: Judgment is impulsive.   Review of Systems  Constitutional: Negative.   HENT: Negative.    Respiratory: Negative.    Cardiovascular: Negative.   Musculoskeletal:  Positive for joint pain and myalgias.  Psychiatric/Behavioral:  Negative for depression, hallucinations, substance abuse and suicidal ideas. The patient is not nervous/anxious.   Blood pressure 108/85, pulse 75, temperature (!) 97.5 F (36.4 C), temperature source Oral, resp. rate 20, SpO2 95 %. There is no height or weight on file to calculate BMI.  Treatment Plan Summary: Medication management and Plan Psychiatrically cleared to be discharged back to nursing facility. Increased Risperdal to 0.5 mg  bid for agitation  Disposition: No evidence of imminent risk to self or others at present.   Patient does not meet criteria for psychiatric inpatient admission. Supportive therapy provided about ongoing stressors. Discussed crisis plan, support from social network, calling 911, coming to the Emergency Department, and calling Suicide Hotline.  Secure message sent to patients nurse.  Psychiatric consult completed and patient psychiatric cleared to return to nursing facility.  Medication adjustments made to Risperdal increasing to 0.5 mg Bid for agitation.  Patient to follow up with neurologist or PCP.  Social work consult order to arrange transportation back to facility or if patient is able to return to facility.     [place]us Ice, NP 05/26/2021 3:58 PM

## 2021-05-26 NOTE — BH Assessment (Signed)
This Clinical research associate spoke with patient's daughter to inform of disposition Earney Navy [phone] who stated that a [facility] employee had already contacted her in reference to patient being discharged and patient will be allowed to return to her assisted living facility at [facility] this date.  ?

## 2021-07-23 DEATH — deceased
